# Patient Record
Sex: Female | Born: 1945 | Race: White | Hispanic: No | Marital: Married | Smoking: Never smoker
Health system: Southern US, Community
[De-identification: ages and names within clinical notes are randomized; demographics above are authoritative.]

## PROBLEM LIST (undated history)

## (undated) DIAGNOSIS — C801 Malignant (primary) neoplasm, unspecified: Secondary | ICD-10-CM

## (undated) DIAGNOSIS — T4145XA Adverse effect of unspecified anesthetic, initial encounter: Secondary | ICD-10-CM

## (undated) DIAGNOSIS — R51 Headache: Secondary | ICD-10-CM

## (undated) DIAGNOSIS — N950 Postmenopausal bleeding: Secondary | ICD-10-CM

## (undated) DIAGNOSIS — L299 Pruritus, unspecified: Secondary | ICD-10-CM

## (undated) DIAGNOSIS — R112 Nausea with vomiting, unspecified: Secondary | ICD-10-CM

## (undated) DIAGNOSIS — Z9889 Other specified postprocedural states: Secondary | ICD-10-CM

## (undated) DIAGNOSIS — R52 Pain, unspecified: Secondary | ICD-10-CM

## (undated) DIAGNOSIS — E039 Hypothyroidism, unspecified: Secondary | ICD-10-CM

## (undated) DIAGNOSIS — T8859XA Other complications of anesthesia, initial encounter: Secondary | ICD-10-CM

## (undated) HISTORY — PX: DILATION AND CURETTAGE OF UTERUS: SHX78

## (undated) HISTORY — PX: EXPLORATORY LAPAROTOMY: SUR591

## (undated) HISTORY — DX: Pruritus, unspecified: L29.9

## (undated) HISTORY — PX: BREAST SURGERY: SHX581

## (undated) HISTORY — DX: Pain, unspecified: R52

---

## 1998-04-08 ENCOUNTER — Other Ambulatory Visit: Admission: RE | Admit: 1998-04-08 | Discharge: 1998-04-08 | Payer: Self-pay | Admitting: Obstetrics & Gynecology

## 1999-04-10 ENCOUNTER — Other Ambulatory Visit: Admission: RE | Admit: 1999-04-10 | Discharge: 1999-04-10 | Payer: Self-pay | Admitting: Obstetrics & Gynecology

## 1999-05-07 ENCOUNTER — Other Ambulatory Visit: Admission: RE | Admit: 1999-05-07 | Discharge: 1999-05-07 | Payer: Self-pay | Admitting: Family Medicine

## 2000-05-17 ENCOUNTER — Other Ambulatory Visit: Admission: RE | Admit: 2000-05-17 | Discharge: 2000-05-17 | Payer: Self-pay | Admitting: Obstetrics & Gynecology

## 2001-06-13 ENCOUNTER — Other Ambulatory Visit: Admission: RE | Admit: 2001-06-13 | Discharge: 2001-06-13 | Payer: Self-pay | Admitting: Obstetrics & Gynecology

## 2002-07-31 ENCOUNTER — Other Ambulatory Visit: Admission: RE | Admit: 2002-07-31 | Discharge: 2002-07-31 | Payer: Self-pay | Admitting: Obstetrics & Gynecology

## 2003-08-07 ENCOUNTER — Other Ambulatory Visit: Admission: RE | Admit: 2003-08-07 | Discharge: 2003-08-07 | Payer: Self-pay | Admitting: Obstetrics & Gynecology

## 2004-09-14 ENCOUNTER — Other Ambulatory Visit: Admission: RE | Admit: 2004-09-14 | Discharge: 2004-09-14 | Payer: Self-pay | Admitting: Obstetrics & Gynecology

## 2006-10-04 HISTORY — PX: FINGER SURGERY: SHX640

## 2008-02-07 ENCOUNTER — Ambulatory Visit (HOSPITAL_BASED_OUTPATIENT_CLINIC_OR_DEPARTMENT_OTHER): Admission: RE | Admit: 2008-02-07 | Discharge: 2008-02-07 | Payer: Self-pay | Admitting: *Deleted

## 2008-02-07 ENCOUNTER — Encounter (INDEPENDENT_AMBULATORY_CARE_PROVIDER_SITE_OTHER): Payer: Self-pay | Admitting: *Deleted

## 2010-08-03 ENCOUNTER — Encounter: Admission: RE | Admit: 2010-08-03 | Discharge: 2010-08-03 | Payer: Self-pay | Admitting: Radiology

## 2010-08-05 ENCOUNTER — Ambulatory Visit: Payer: Self-pay | Admitting: Oncology

## 2010-08-14 LAB — CBC WITH DIFFERENTIAL/PLATELET
BASO%: 0.3 % (ref 0.0–2.0)
Basophils Absolute: 0 10*3/uL (ref 0.0–0.1)
EOS%: 0.7 % (ref 0.0–7.0)
Eosinophils Absolute: 0.1 10*3/uL (ref 0.0–0.5)
HCT: 40.8 % (ref 34.8–46.6)
HGB: 14 g/dL (ref 11.6–15.9)
LYMPH%: 22.7 % (ref 14.0–49.7)
MCH: 31 pg (ref 25.1–34.0)
MCHC: 34.4 g/dL (ref 31.5–36.0)
MCV: 90.1 fL (ref 79.5–101.0)
MONO#: 0.6 10*3/uL (ref 0.1–0.9)
MONO%: 6.8 % (ref 0.0–14.0)
NEUT#: 6.4 10*3/uL (ref 1.5–6.5)
NEUT%: 69.5 % (ref 38.4–76.8)
Platelets: 368 10*3/uL (ref 145–400)
RBC: 4.52 10*6/uL (ref 3.70–5.45)
RDW: 12.9 % (ref 11.2–14.5)
WBC: 9.2 10*3/uL (ref 3.9–10.3)
lymph#: 2.1 10*3/uL (ref 0.9–3.3)

## 2010-08-14 LAB — COMPREHENSIVE METABOLIC PANEL
ALT: 13 U/L (ref 0–35)
AST: 21 U/L (ref 0–37)
Albumin: 4.8 g/dL (ref 3.5–5.2)
Alkaline Phosphatase: 67 U/L (ref 39–117)
BUN: 15 mg/dL (ref 6–23)
CO2: 25 mEq/L (ref 19–32)
Calcium: 9.9 mg/dL (ref 8.4–10.5)
Chloride: 103 mEq/L (ref 96–112)
Creatinine, Ser: 0.75 mg/dL (ref 0.40–1.20)
Glucose, Bld: 120 mg/dL — ABNORMAL HIGH (ref 70–99)
Potassium: 3.9 mEq/L (ref 3.5–5.3)
Sodium: 142 mEq/L (ref 135–145)
Total Bilirubin: 0.5 mg/dL (ref 0.3–1.2)
Total Protein: 7.7 g/dL (ref 6.0–8.3)

## 2010-08-25 ENCOUNTER — Ambulatory Visit
Admission: RE | Admit: 2010-08-25 | Discharge: 2010-09-30 | Payer: Self-pay | Source: Home / Self Care | Attending: Radiation Oncology | Admitting: Radiation Oncology

## 2010-09-21 ENCOUNTER — Ambulatory Visit
Admission: RE | Admit: 2010-09-21 | Discharge: 2010-09-21 | Payer: Self-pay | Source: Home / Self Care | Attending: Surgery | Admitting: Surgery

## 2010-10-26 LAB — DIFFERENTIAL
Basophils Absolute: 0 10*3/uL (ref 0.0–0.1)
Basophils Relative: 0 % (ref 0–1)
Eosinophils Absolute: 0.3 10*3/uL (ref 0.0–0.7)
Eosinophils Relative: 3 % (ref 0–5)
Lymphocytes Relative: 17 % (ref 12–46)
Lymphs Abs: 1.9 10*3/uL (ref 0.7–4.0)
Monocytes Absolute: 0.9 10*3/uL (ref 0.1–1.0)
Monocytes Relative: 8 % (ref 3–12)
Neutro Abs: 7.8 10*3/uL — ABNORMAL HIGH (ref 1.7–7.7)
Neutrophils Relative %: 71 % (ref 43–77)

## 2010-10-26 LAB — CBC
HCT: 39.8 % (ref 36.0–46.0)
Hemoglobin: 13 g/dL (ref 12.0–15.0)
MCH: 30 pg (ref 26.0–34.0)
MCHC: 32.7 g/dL (ref 30.0–36.0)
RBC: 4.34 MIL/uL (ref 3.87–5.11)
RDW: 13.2 % (ref 11.5–15.5)
WBC: 11 10*3/uL — ABNORMAL HIGH (ref 4.0–10.5)

## 2010-10-28 ENCOUNTER — Ambulatory Visit (HOSPITAL_BASED_OUTPATIENT_CLINIC_OR_DEPARTMENT_OTHER)
Admission: RE | Admit: 2010-10-28 | Discharge: 2010-10-28 | Payer: Self-pay | Source: Home / Self Care | Attending: Surgery | Admitting: Surgery

## 2010-10-29 NOTE — Op Note (Addendum)
NAMESIARA, GORDER                ACCOUNT NO.:  [id number]  MEDICAL RECORD NO.:  [id number]          PATIENT TYPE:  AMB  LOCATION:  DSC                          FACILITY:  [facility]  PHYSICIAN:  Currie Paris, M.D.DATE OF BIRTH:  07/27/1946  DATE OF PROCEDURE:  10/28/2010 DATE OF DISCHARGE:                              OPERATIVE REPORT   PREOPERATIVE DIAGNOSIS:  Ductal carcinoma in situ, left breast, upper outer quadrant with positive margins, status post lumpectomy.  POSTOPERATIVE DIAGNOSIS:  Ductal carcinoma in situ, left breast, upper outer quadrant with positive margins, status post lumpectomy.  PROCEDURE:  Re-excision lumpectomy cavity (superior lateral margins).  SURGEON:  Currie Paris, MD  ANESTHESIA:  General.  CLINICAL HISTORY:  This is a 65 year old lady who was diagnosed with DCIS of the left breast, upper outer quadrant.  She underwent a needle- guided lumpectomy and at the time of surgery after the lumpectomy had been done with tissue around the guidewire, a small area of palpable abnormality was noted along the lateral superior margin and that was taken.  Unfortunately that was a separate area of DCIS and the margin on that was involved.  Subsequently, she underwent a mammogram showing no residual calcifications, the initial area had been removed.  She was then scheduled for re-excision.  She wished to continue to pursue breast conservation.  DESCRIPTION OF PROCEDURE:  I saw the patient in the holding area and she had no further questions.  We confirmed the procedure as noted above and I initialed the left breast.  We noted that DCIS is a microscopic disease and I will have really no visual clues as to how much tissue to take and planned to take about a centimeter thick area of tissue trying to get negative margins.  The patient was then taken to the operating room and after satisfactory general (LMA) anesthesia had been obtained, the left breast  was prepped and draped and the time-out was done.  I opened up almost all the prior excision, leaving perhaps 2 cm medially unopened.  I divided the subcutaneous tissue with cautery and entered all the lumpectomy cavity.  I then excised the lateral half of the superior margin as well as the lateral margin.  I took what appeared to be about a centimeter thick, although as it came out, it contracted somewhat.  I was almost towards the axilla and the margins left behind for all fatty tissue with no obvious breast tissue present.  I was down to the chest wall, and there was no fascia left.  I saw the lateral clip and that was came out as we were working, and I think the original superior clip came out as well, so I put two new clips in prior to closing.  Once I had this tissue out, I marked it for orientation and sent it to Pathology for permanent exam.  I put 20 mL of 0.25% plain Marcaine in to help with postop analgesia. We irrigated and made sure everything was dry, and then closed it with 3- 0 Vicryl, 4-0 Monocryl subcuticular and Dermabond. The patient tolerated the procedure well, and there  were no complications.  All counts were correct.     Currie Paris, M.D.     CJS/MEDQ  D:  10/28/2010  T:  10/28/2010  Job:  [id number]  cc:   Jeralyn Ruths, MD Ilda Mori, M.D. Maryln Gottron, M.D. Valentino Hue. Magrinat, M.D.  Electronically Signed by Cyndia Bent M.D. on 10/29/2010 07:31:44 AM

## 2010-10-30 ENCOUNTER — Ambulatory Visit: Payer: Self-pay | Admitting: Oncology

## 2010-11-02 ENCOUNTER — Ambulatory Visit
Admission: RE | Admit: 2010-11-02 | Discharge: 2010-11-03 | Payer: Self-pay | Source: Home / Self Care | Attending: Radiation Oncology | Admitting: Radiation Oncology

## 2010-12-14 LAB — BASIC METABOLIC PANEL
BUN: 12 mg/dL (ref 6–23)
CO2: 28 mEq/L (ref 19–32)
Calcium: 9.3 mg/dL (ref 8.4–10.5)
Chloride: 105 mEq/L (ref 96–112)
Creatinine, Ser: 0.64 mg/dL (ref 0.4–1.2)
GFR calc Af Amer: >60 mL/min (ref >60)
GFR calc non Af Amer: >60 mL/min (ref >60)
Glucose, Bld: 95 mg/dL (ref 70–99)
Potassium: 3.9 mEq/L (ref 3.5–5.1)
Sodium: 139 mEq/L (ref 135–145)

## 2010-12-14 LAB — POCT HEMOGLOBIN-HEMACUE: Hemoglobin: 14.2 g/dL (ref 12.0–15.0)

## 2011-02-16 NOTE — Op Note (Signed)
NAMEJAZIRA, [name]                ACCOUNT NO.:  [id number]   MEDICAL RECORD NO.:  [id number]          PATIENT TYPE:  AMB   LOCATION:  DSC                          FACILITY:  [facility]   PHYSICIAN:  Tennis Must Meyerdierks, M.D.DATE OF BIRTH:  [birth year]   DATE OF PROCEDURE:  02/07/2008  DATE OF DISCHARGE:                               OPERATIVE REPORT   PREOPERATIVE DIAGNOSIS:  Osteomyelitis distal phalanx left ring finger.   POSTOPERATIVE DIAGNOSIS:  Osteomyelitis distal phalanx left ring finger.   PROCEDURE:  Incision and drainage of distal phalanx left ring finger.   SURGEON:  Lowell Bouton, MD   ANESTHESIA:  General.   OPERATIVE FINDINGS:  The patient had a puncture wound over the volar  aspect of the DIP crease.  It was in the midline and there was some  scarring around the opening subcutaneously.  There was a defect  longitudinally in the profundus tendon attachment that went down to the  bone.  It was consistent with a cat bite going through the skin through  the tendon and into the bone.  There was purulent material in the bone  and in the subcutaneous tissues.   PROCEDURE:  Under general anesthesia with a tourniquet on the left arm,  the left hand was prepped and draped in the usual fashion and after  elevating the limb, the tourniquet was inflated to 250 mmHg.  A Brunner  approach was made to the DIP joint and blunt dissection was carried  through the subcutaneous tissues.  A flap was elevated ulnarly and the  flexor tendon was identified.  It was found to have a longitudinal split  with a hole that went down to the distal phalanx.  The distal phalanx  was drilled with a 45 K-wire and then curetted.  Cultures were obtained  in the subcutaneous tissues and also in the bone.  Some of the bone was  curetted and sent to pathology.  The bone was then irrigated with a 22-  gauge needle and a 10 mL syringe using saline.  The wound was packed  with iodoform gauze  into the bone.  The skin was partially closed with 4-  0 nylon sutures.  A 0.50% Marcaine digital block was inserted into the  finger for pain control.  Sterile dressings were applied.  The  tourniquet was released with good circulation of the hand.  The patient  went to the recovery room, awake, stable and in good condition.      Lowell Bouton, M.D.  Electronically Signed     EMM/MEDQ  D:  02/07/2008  T:  02/08/2008  Job:  [id number]   cc:   Harrel Lemon. Merla Riches, M.D.

## 2011-04-09 ENCOUNTER — Ambulatory Visit (HOSPITAL_COMMUNITY): Admission: RE | Admit: 2011-04-09 | Payer: Self-pay | Source: Ambulatory Visit | Admitting: Obstetrics & Gynecology

## 2011-04-13 ENCOUNTER — Encounter (HOSPITAL_COMMUNITY)
Admission: RE | Admit: 2011-04-13 | Discharge: 2011-04-13 | Disposition: A | Discharge status: Home / Self Care | Payer: Medicare Other | Source: Ambulatory Visit | Attending: Obstetrics & Gynecology | Admitting: Obstetrics & Gynecology

## 2011-04-13 ENCOUNTER — Other Ambulatory Visit: Payer: Self-pay

## 2011-04-13 ENCOUNTER — Encounter (HOSPITAL_COMMUNITY): Payer: Self-pay

## 2011-04-13 HISTORY — DX: Nausea with vomiting, unspecified: R11.2

## 2011-04-13 HISTORY — DX: Other specified postprocedural states: Z98.890

## 2011-04-13 HISTORY — DX: Hypothyroidism, unspecified: E03.9

## 2011-04-13 HISTORY — DX: Headache: R51

## 2011-04-13 HISTORY — DX: Other complications of anesthesia, initial encounter: T88.59XA

## 2011-04-13 HISTORY — DX: Adverse effect of unspecified anesthetic, initial encounter: T41.45XA

## 2011-04-13 HISTORY — DX: Malignant (primary) neoplasm, unspecified: C80.1

## 2011-04-13 LAB — CBC
Hemoglobin: 13.3 g/dL (ref 12.0–15.0)
MCH: 30.7 pg (ref 26.0–34.0)
MCHC: 32.8 g/dL (ref 30.0–36.0)
MCV: 93.5 fL (ref 78.0–100.0)
Platelets: 283 10*3/uL (ref 150–400)
RBC: 4.33 MIL/uL (ref 3.87–5.11)
RDW: 13.6 % (ref 11.5–15.5)
WBC: 11.1 10*3/uL — ABNORMAL HIGH (ref 4.0–10.5)

## 2011-04-13 NOTE — Anesthesia Preprocedure Evaluation (Addendum)
Anesthesia Evaluation  Name, MR# and DOB Patient awake  General Assessment Comment  Reviewed: Allergy & Precautions, H&P  and Patient's Chart, lab work & pertinent test results  Airway Mallampati: II TM Distance: >3 FB     Dental  (+) Teeth Intact   Pulmonaryneg pulmonary ROS        Cardiovascular regular Normal   Neuro/Psych  GI/Hepatic/Renal negative GI ROS, negative Liver ROS, and negative Renal ROS (+)       Endo/Other   Abdominal   Musculoskeletal negative musculoskeletal ROS (+)  Hematology negative hematology ROS (+)   Peds  Reproductive/Obstetrics negative OB ROS   Anesthesia Other Findings             Anesthesia Physical Anesthesia Plan  ASA: II  Anesthesia Plan: General   Post-op Pain Management:    Induction:   Airway Management Planned:   Additional Equipment:   Intra-op Plan:   Post-operative Plan:   Informed Consent: I have reviewed the patients History and Physical, chart, labs and discussed the procedure including the risks, benefits and alternatives for the proposed anesthesia with the patient or authorized representative who has indicated his/her understanding and acceptance.   Dental Advisory Given  Plan Discussed with:   Anesthesia Plan Comments:         Anesthesia Quick Evaluation

## 2011-04-13 NOTE — Patient Instructions (Addendum)
20 Janne Faulk  04/13/2011   Your procedure is scheduled on:  04/16/11  Report to [facility] at 0600 AM.  Call this number if you have problems the morning of surgery: [phone]   Remember:   Do not eat food:After Midnight.  Do not drink clear liquids: After Midnight.  Take these medicines the morning of surgery with A SIP OF WATER: none   Do not wear jewelry, make-up or nail polish.  Do not bring valuables to the hospital.  Contacts, dentures or bridgework may not be worn into surgery.  Leave suitcase in the car. After surgery it may be brought to your room.  For patients admitted to the hospital, checkout time is 11:00 AM the day of discharge.   Patients discharged the day of surgery will not be allowed to drive home.  Name and phone number of your driver: [phone]  Special Instructions: CHG Shower Shower 2 days before surgery and 1 day before surgery with Hibiclens.   Please read over the following fact sheets that you were given: Pain Booklet and Surgical Site Infection Prevention

## 2011-04-15 NOTE — H&P (Signed)
Kaitelyn Jamison is an 65 y.o. female. G 0  Pertinent Gynecological History: Menses: post-menopausal Bleeding: post menopausal bleeding Previous GYN Procedures: None  Last mammogram: Breast cancer currently on Tamoxifin Date: 2011 Last pap: normal Date: 2011     Past Medical History  Diagnosis Date  . Complication of anesthesia     throat felt tight upon waking up, later had fluid  retention of hands and feet  . PONV (postoperative nausea and vomiting)   . Cancer   . Hypothyroidism   . Headache     Past Surgical History  Procedure Date  . Breast surgery   . Finger surgery 2008  . Exploratory laparotomy     No family history on file.  Social History:  reports that she has never smoked. She does not have any smokeless tobacco history on file. She reports that she drinks about .6 ounces of alcohol per week. She reports that she does not use illicit drugs.  Allergies:  Allergies  Allergen Reactions  . Percocet (Oxycodone-Acetaminophen) Rash    No prescriptions prior to admission    ROS Non-contributory  Physical Exam Cardiovascular exam was normal.  Her pelvic exam showed no unusual masses or tenderness.  Her uterus was normal size and anterior.   Assessment/Plan: She is under treatment for early stage breast cancer with Tamoxifen.  She developed some bleeding and her ultrasound showed a thickened endometrium.  She requested hysteroscopy and D&C for diagnosis since it is more sensitive and less painful than an office [place].     Isai Gottlieb D 04/15/2011, 4:16 PM

## 2011-04-16 ENCOUNTER — Encounter (HOSPITAL_COMMUNITY): Payer: Self-pay | Admitting: Anesthesiology

## 2011-04-16 ENCOUNTER — Ambulatory Visit (HOSPITAL_COMMUNITY)
Admission: RE | Admit: 2011-04-16 | Discharge: 2011-04-16 | Disposition: A | Discharge status: Home / Self Care | Payer: Medicare Other | Source: Ambulatory Visit | Attending: Obstetrics & Gynecology | Admitting: Obstetrics & Gynecology

## 2011-04-16 ENCOUNTER — Other Ambulatory Visit: Payer: Self-pay | Admitting: Obstetrics & Gynecology

## 2011-04-16 ENCOUNTER — Encounter (HOSPITAL_COMMUNITY): Payer: Self-pay | Admitting: Obstetrics & Gynecology

## 2011-04-16 ENCOUNTER — Encounter (HOSPITAL_COMMUNITY)
Admission: RE | Disposition: A | Discharge status: Home / Self Care | Payer: Self-pay | Source: Ambulatory Visit | Attending: Obstetrics & Gynecology

## 2011-04-16 ENCOUNTER — Ambulatory Visit (HOSPITAL_COMMUNITY): Payer: Medicare Other | Admitting: Anesthesiology

## 2011-04-16 DIAGNOSIS — R9389 Abnormal findings on diagnostic imaging of other specified body structures: Secondary | ICD-10-CM | POA: Insufficient documentation

## 2011-04-16 DIAGNOSIS — N840 Polyp of corpus uteri: Secondary | ICD-10-CM | POA: Insufficient documentation

## 2011-04-16 DIAGNOSIS — N950 Postmenopausal bleeding: Secondary | ICD-10-CM | POA: Diagnosis present

## 2011-04-16 DIAGNOSIS — Z01812 Encounter for preprocedural laboratory examination: Secondary | ICD-10-CM | POA: Insufficient documentation

## 2011-04-16 DIAGNOSIS — Z01818 Encounter for other preprocedural examination: Secondary | ICD-10-CM | POA: Insufficient documentation

## 2011-04-16 HISTORY — DX: Postmenopausal bleeding: N95.0

## 2011-04-16 SURGERY — DILATATION & CURETTAGE/HYSTEROSCOPY WITH RESECTOCOPE
Anesthesia: Choice

## 2011-04-16 MED ORDER — MIDAZOLAM HCL 5 MG/5ML IJ SOLN
INTRAMUSCULAR | Status: DC | PRN
  Administered 2011-04-16: 2 mg via INTRAVENOUS

## 2011-04-16 MED ORDER — ONDANSETRON HCL 4 MG/2ML IJ SOLN: 4.0000 mg | Freq: Once | INTRAMUSCULAR | Status: DC | PRN

## 2011-04-16 MED ORDER — ACETAMINOPHEN 325 MG PO TABS: 325.0000 mg | ORAL_TABLET | ORAL | Status: DC | PRN

## 2011-04-16 MED ORDER — DEXAMETHASONE SODIUM PHOSPHATE 10 MG/ML IJ SOLN
INTRAMUSCULAR | Status: AC
  Filled 2011-04-16: qty 1

## 2011-04-16 MED ORDER — FENTANYL CITRATE 0.05 MG/ML IJ SOLN
25.0000 ug | INTRAMUSCULAR | Status: DC | PRN
  Administered 2011-04-16: 50 ug via INTRAVENOUS

## 2011-04-16 MED ORDER — GLYCINE 1.5 % IR SOLN
Status: DC | PRN
  Administered 2011-04-16: 3000 mL

## 2011-04-16 MED ORDER — PROPOFOL 10 MG/ML IV EMUL
INTRAVENOUS | Status: AC
  Filled 2011-04-16: qty 40

## 2011-04-16 MED ORDER — MEPERIDINE HCL 25 MG/ML IJ SOLN: 6.2500 mg | INTRAMUSCULAR | Status: DC | PRN

## 2011-04-16 MED ORDER — PROPOFOL 10 MG/ML IV EMUL
INTRAVENOUS | Status: AC
  Filled 2011-04-16: qty 20

## 2011-04-16 MED ORDER — LACTATED RINGERS IV SOLN
INTRAVENOUS | Status: DC
  Administered 2011-04-16 (×2): via INTRAVENOUS
  Filled 2011-04-16: qty 1000

## 2011-04-16 MED ORDER — LIDOCAINE HCL (CARDIAC) 20 MG/ML IV SOLN
INTRAVENOUS | Status: AC
  Filled 2011-04-16: qty 5

## 2011-04-16 MED ORDER — FENTANYL CITRATE 0.05 MG/ML IJ SOLN
INTRAMUSCULAR | Status: DC | PRN
  Administered 2011-04-16: 100 ug via INTRAVENOUS

## 2011-04-16 MED ORDER — MIDAZOLAM HCL 2 MG/2ML IJ SOLN
INTRAMUSCULAR | Status: AC
  Filled 2011-04-16: qty 2

## 2011-04-16 MED ORDER — ONDANSETRON HCL 4 MG/2ML IJ SOLN
INTRAMUSCULAR | Status: AC
  Filled 2011-04-16: qty 2

## 2011-04-16 MED ORDER — LIDOCAINE HCL 2 % IJ SOLN
INTRAMUSCULAR | Status: DC | PRN
  Administered 2011-04-16: 10 mL via INTRADERMAL

## 2011-04-16 MED ORDER — LIDOCAINE HCL 2 % EX GEL
CUTANEOUS | Status: DC | PRN
  Administered 2011-04-16: 80 via TOPICAL

## 2011-04-16 MED ORDER — ONDANSETRON HCL 4 MG/2ML IJ SOLN
INTRAMUSCULAR | Status: DC | PRN
  Administered 2011-04-16: 4 mg via INTRAVENOUS

## 2011-04-16 MED ORDER — PROPOFOL 10 MG/ML IV EMUL
INTRAVENOUS | Status: DC | PRN
  Administered 2011-04-16: 30 mg via INTRAVENOUS
  Administered 2011-04-16: 40 mg via INTRAVENOUS
  Administered 2011-04-16 (×2): 50 mg via INTRAVENOUS
  Administered 2011-04-16: 20 mg via INTRAVENOUS
  Administered 2011-04-16: 30 mg via INTRAVENOUS
  Administered 2011-04-16: 20 mg via INTRAVENOUS
  Administered 2011-04-16: 30 mg via INTRAVENOUS

## 2011-04-16 MED ORDER — FENTANYL CITRATE 0.05 MG/ML IJ SOLN
INTRAMUSCULAR | Status: AC
  Filled 2011-04-16: qty 2

## 2011-04-16 SURGICAL SUPPLY — 16 items
CANISTER SUCTION 2500CC (MISCELLANEOUS) ×2 IMPLANT
CATH ROBINSON RED A/P 16FR (CATHETERS) ×2 IMPLANT
CLOTH BEACON ORANGE TIMEOUT ST (SAFETY) ×2 IMPLANT
CONTAINER PREFILL 10% NBF 60ML (FORM) ×2 IMPLANT
DRAPE UTILITY XL STRL (DRAPES) ×4 IMPLANT
ELECT REM PT RETURN 9FT ADLT (ELECTROSURGICAL) ×2
ELECTRODE REM PT RTRN 9FT ADLT (ELECTROSURGICAL) ×1 IMPLANT
ELECTRODE ROLLER BARREL 22FR (ELECTROSURGICAL) IMPLANT
ELECTRODE VAPORCUT 22FR (ELECTROSURGICAL) IMPLANT
GLOVE BIO SURGEON STRL SZ 6.5 (GLOVE) ×4 IMPLANT
GOWN BRE IMP SLV AUR LG STRL (GOWN DISPOSABLE) ×4 IMPLANT
LOOP ANGLED CUTTING 22FR (CUTTING LOOP) ×2 IMPLANT
NEEDLE SPNL 20GX3.5 QUINCKE YW (NEEDLE) IMPLANT
PACK HYSTEROSCOPY LF (CUSTOM PROCEDURE TRAY) ×2 IMPLANT
TOWEL OR 17X24 6PK STRL BLUE (TOWEL DISPOSABLE) ×4 IMPLANT
WATER STERILE IRR 1000ML POUR (IV SOLUTION) ×2 IMPLANT

## 2011-04-16 NOTE — Anesthesia Postprocedure Evaluation (Signed)
  Anesthesia Post-op Note  Patient: Taylor [name]  Procedure(s) Performed:  DILATATION & CURRETTAGE/HYSTEROSCOPY WITH RESECTOCOPE  Patient is awake and responsive. Pain and nausea are reasonably well controlled. Vital signs are stable and clinically acceptable. Oxygen saturation is clinically acceptable. There are no apparent anesthetic complications at this time. Patient is ready for discharge.

## 2011-04-16 NOTE — Transfer of Care (Signed)
Immediate Anesthesia Transfer of Care Note  Patient: Taylor [name]  Procedure(s) Performed:  DILATATION & CURRETTAGE/HYSTEROSCOPY WITH RESECTOCOPE  Patient Location: PACU  Anesthesia Type: MAC  Level of Consciousness: awake, alert , oriented and patient cooperative  Airway & Oxygen Therapy: Patient Spontanous Breathing  Post-op Assessment: Report given to PACU RN, Post -op Vital signs reviewed and stable and Patient moving all extremities  Post vital signs: Reviewed and stable  Complications: No apparent anesthesia complications

## 2011-04-16 NOTE — Brief Op Note (Signed)
04/16/2011  8:52 AM  PATIENT:  Taylor [name]  65 y.o. female  PRE-OPERATIVE DIAGNOSIS:  post menopausal bleeding  POST-OPERATIVE DIAGNOSIS:  same  PROCEDURE:  Procedure(s): DILATATION & CURRETTAGE/HYSTEROSCOPY WITH RESECTOSCOPE/POLYPECTOMY  SURGEON:  Ilda Mori  ANESTHESIA:   local and IV sedation  ESTIMATED BLOOD LOSS: minimal   BLOOD ADMINISTERED:none  LOCAL MEDICATIONS USED: 2% LIDOCAINE 10 CC  SPECIMEN:  Source of Specimen:  endometrial polyps  DISPOSITION OF SPECIMEN:  PATHOLOGY  DICTATION #: [id number]  PATIENT DISPOSITION:  Home

## 2011-04-16 NOTE — Op Note (Addendum)
Preoperative diagnosis: Postmenopausal bleeding Postoperative diagnosis: Endometrial polyps Procedure: Hysteroscopy, DNC, polypectomy Surgeon: Ilda Mori Anesthesia: Paracervical block, IV sedation Estimated blood loss: Minimal Findings: Multiple 1-3 cm endometrial polyps  Indications: This is a 65 year old nulligravida female on tamoxifen who was found to have a thickened endometrium on office ultrasound. She has also experienced postmenopausal bleeding.  Procedure: The patient was placed in the dorsolithotomy position and the vagina and vulva were prepped and draped in sterile fashion. IV sedation was administered and a paracervical block with 10 mL of 2% lidocaine was placed. The internal os was dilated with Shawnie Pons dilators to a 21 [place]. The diagnostic hysteroscope was introduced and multiple polyps were found. The hysteroscope was removed and polyp forceps were used to remove the polyps. The hysteroscope was reinserted and several areas of incomplete movable were noted. The internal os was dilated to 5 [place] and the resectoscope was introduced. Using the small loop the remainder of the polyps were removed with cutting current. The uterine cavity was reinspected and felt to be free of polyps. The procedure was then terminated and the patient left the operative room in good condition.  This is Dr. Ilda Mori completing the dictation on Taylor [name].

## 2011-04-16 NOTE — Op Note (Signed)
Taylor [name], Taylor [name]                ACCOUNT NO.:  [id number]  MEDICAL RECORD NO.:  [id number]  LOCATION:  [facility]                          FACILITY:  [facility]  PHYSICIAN:  Ilda Mori, M.D.   DATE OF BIRTH:  1945/10/13  DATE OF PROCEDURE:  04/16/2011 DATE OF DISCHARGE:                              OPERATIVE REPORT   PREOPERATIVE DIAGNOSIS:  Postmenopausal bleeding.  POSTOPERATIVE DIAGNOSIS:  Endometrial polyps.  PROCEDURES:  Hysteroscopy, dilation and curettage and polypectomy with the aid of resectoscope.  SURGEON:  Ilda Mori, MD  ANESTHESIA:  IV sedation with paracervical block.  ESTIMATED BLOOD LOSS:  Minimal.  FINDINGS:  Multiple large endometrial polyps.  No suspicious findings for malignancy.  The endometrium was atrophic.  INDICATIONS:  This is a 65 year old female with breast cancers being treated with tamoxifen.  She noted some postmenopausal bleeding and an office ultrasound showed a thickened endometrial cavity.  She was brought to the operating room for hysteroscopy and D and C.  PROCEDURE:  The patient was brought to the operating room and placed in dorsal lithotomy position.  IV sedation was administered.  The perineum and vagina were prepped and draped in the sterile fashion.  A speculum was placed and the cervix was visualized, 10 mL of 2% lidocaine was placed in the paracervical tissues.  The uterus sounded to 8-cm.  The internal os was dilated with Shawnie Pons dilators to a 23-French.  A diagnostic hysteroscope was introduced and multiple polyps were seen. The hysteroscope was removed and a polyp forceps was used to remove the polyps.  Hysteroscope was reintroduced and some remnants of polyps were seen to be remaining and after multiple attempts, the decision was made to proceed with resectoscope.  The internal os was dilated to 27-French. The resectoscope was placed and using the small loop, the base of the polyps was removed so at the end of the procedure, all  the polyps appeared to be satisfactory, removed from the endometrial cavity.  The procedure was then terminated and the patient left the operating room in good condition.    Ilda Mori, M.D.    RK/MEDQ  D:  04/16/2011  T:  04/16/2011  Job:  [id number]

## 2011-05-04 ENCOUNTER — Ambulatory Visit (INDEPENDENT_AMBULATORY_CARE_PROVIDER_SITE_OTHER): Payer: Medicare Other | Admitting: Surgery

## 2011-05-04 ENCOUNTER — Encounter (INDEPENDENT_AMBULATORY_CARE_PROVIDER_SITE_OTHER): Payer: Self-pay | Admitting: Surgery

## 2011-05-04 VITALS — BP 134/82 | HR 68 | Temp 97.0°F

## 2011-05-04 DIAGNOSIS — Z853 Personal history of malignant neoplasm of breast: Secondary | ICD-10-CM

## 2011-05-04 NOTE — Progress Notes (Signed)
05/04/2011  Taylor [name] is a 65 y.o.female who presents for routine followup of her LUOQ DCISdiagnosed in Oct 2011 and treated with Lumpectomy and anti-estorgen, no radiation. She has no problems or concerns on either side.She notes a lack of firmness above the incison  PFSH SheThe only thing new here is that she developed uterine polyps from tamoxifen. She's had a D&C.  ROS There have been no significant changes since the last visit here  General: The patient is alert, oriented, generally healty appearing, NAD. Mood and affect are normal.  Breasts : Basically symmetric. The right breast is normal. There is some loss of breast tissue to palpation above the lumpectomy incision. There are no new masses and no suggestion of a recurrence Lymphatics: She has no axillary or supraclavicular adenopathy on either side.  Extremities: Full ROM of the surgical side with no lymphedema noted.  Data Reviewed: No new data - she did bring a review of her current plans  Impression: S/P lumpectomy for DCIS, left, no concerns identified   Plan: I told her we could see her here in six months but if she is doing close follow-up at [facility], then she could omit visits with me

## 2011-08-05 ENCOUNTER — Other Ambulatory Visit: Payer: Self-pay | Admitting: Radiology

## 2011-08-05 DIAGNOSIS — Z9889 Other specified postprocedural states: Secondary | ICD-10-CM

## 2011-08-05 DIAGNOSIS — Z853 Personal history of malignant neoplasm of breast: Secondary | ICD-10-CM

## 2011-09-21 ENCOUNTER — Ambulatory Visit
Admission: RE | Admit: 2011-09-21 | Discharge: 2011-09-21 | Disposition: A | Discharge status: Home / Self Care | Payer: Medicare Other | Source: Ambulatory Visit | Attending: Radiology | Admitting: Radiology

## 2011-09-21 DIAGNOSIS — Z853 Personal history of malignant neoplasm of breast: Secondary | ICD-10-CM

## 2011-09-21 DIAGNOSIS — Z9889 Other specified postprocedural states: Secondary | ICD-10-CM

## 2011-09-21 MED ORDER — GADOBENATE DIMEGLUMINE 529 MG/ML IV SOLN
11.0000 mL | Freq: Once | INTRAVENOUS | Status: AC | PRN
  Administered 2011-09-21: 11 mL via INTRAVENOUS

## 2012-04-03 ENCOUNTER — Other Ambulatory Visit: Payer: Self-pay

## 2012-05-20 ENCOUNTER — Ambulatory Visit: Payer: Medicare Other

## 2012-05-20 ENCOUNTER — Ambulatory Visit (INDEPENDENT_AMBULATORY_CARE_PROVIDER_SITE_OTHER): Payer: Medicare Other | Admitting: Internal Medicine

## 2012-05-20 VITALS — BP 102/70 | HR 62 | Temp 98.2°F | Resp 12 | Ht 62.5 in | Wt 130.0 lb

## 2012-05-20 DIAGNOSIS — S0990XA Unspecified injury of head, initial encounter: Secondary | ICD-10-CM
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
## 2012-08-04 ENCOUNTER — Other Ambulatory Visit: Payer: Self-pay | Admitting: Radiology

## 2012-08-04 DIAGNOSIS — Z9889 Other specified postprocedural states: Secondary | ICD-10-CM

## 2012-08-04 DIAGNOSIS — Z853 Personal history of malignant neoplasm of breast: Secondary | ICD-10-CM

## 2012-09-13 ENCOUNTER — Ambulatory Visit
Admission: RE | Admit: 2012-09-13 | Discharge: 2012-09-13 | Disposition: A | Discharge status: Home / Self Care | Payer: Medicare Other | Source: Ambulatory Visit | Attending: Radiology | Admitting: Radiology

## 2012-09-13 DIAGNOSIS — Z853 Personal history of malignant neoplasm of breast: Secondary | ICD-10-CM

## 2012-09-13 DIAGNOSIS — Z9889 Other specified postprocedural states: Secondary | ICD-10-CM

## 2012-09-13 MED ORDER — GADOBENATE DIMEGLUMINE 529 MG/ML IV SOLN
12.0000 mL | Freq: Once | INTRAVENOUS | Status: AC | PRN
  Administered 2012-09-13: 12 mL via INTRAVENOUS

## 2013-08-01 ENCOUNTER — Other Ambulatory Visit: Payer: Self-pay | Admitting: Internal Medicine

## 2013-08-01 DIAGNOSIS — D0592 Unspecified type of carcinoma in situ of left breast: Secondary | ICD-10-CM

## 2013-09-05 ENCOUNTER — Ambulatory Visit
Admission: RE | Admit: 2013-09-05 | Discharge: 2013-09-05 | Disposition: A | Discharge status: Home / Self Care | Payer: Medicare Other | Source: Ambulatory Visit | Attending: Internal Medicine | Admitting: Internal Medicine

## 2013-09-05 DIAGNOSIS — D0592 Unspecified type of carcinoma in situ of left breast: Secondary | ICD-10-CM

## 2013-09-05 MED ORDER — GADOBENATE DIMEGLUMINE 529 MG/ML IV SOLN
12.0000 mL | Freq: Once | INTRAVENOUS | Status: AC | PRN
  Administered 2013-09-05: 12 mL via INTRAVENOUS

## 2014-08-13 ENCOUNTER — Other Ambulatory Visit: Payer: Self-pay | Admitting: Cardiovascular Disease

## 2014-08-13 DIAGNOSIS — Z803 Family history of malignant neoplasm of breast: Secondary | ICD-10-CM

## 2014-08-13 DIAGNOSIS — Z853 Personal history of malignant neoplasm of breast: Secondary | ICD-10-CM

## 2014-08-27 ENCOUNTER — Ambulatory Visit
Admission: RE | Admit: 2014-08-27 | Discharge: 2014-08-27 | Disposition: A | Discharge status: Home / Self Care | Payer: Medicare Other | Source: Ambulatory Visit | Attending: Cardiovascular Disease | Admitting: Cardiovascular Disease

## 2014-08-27 DIAGNOSIS — Z803 Family history of malignant neoplasm of breast: Secondary | ICD-10-CM

## 2014-08-27 DIAGNOSIS — Z853 Personal history of malignant neoplasm of breast: Secondary | ICD-10-CM

## 2014-08-27 MED ORDER — GADOBENATE DIMEGLUMINE 529 MG/ML IV SOLN
12.0000 mL | Freq: Once | INTRAVENOUS | Status: AC | PRN
  Administered 2014-08-27: 12 mL via INTRAVENOUS

## 2015-05-05 ENCOUNTER — Ambulatory Visit (INDEPENDENT_AMBULATORY_CARE_PROVIDER_SITE_OTHER): Payer: Medicare Other | Admitting: Urgent Care

## 2015-05-05 VITALS — BP 128/82 | HR 128 | Temp 98.0°F | Resp 17 | Ht 63.0 in | Wt 133.0 lb

## 2015-05-05 DIAGNOSIS — S61219A Laceration without foreign body of unspecified finger without damage to nail, initial encounter: Secondary | ICD-10-CM | POA: Diagnosis not present

## 2015-05-05 DIAGNOSIS — S61218A Laceration without foreign body of other finger without damage to nail, initial encounter: Secondary | ICD-10-CM | POA: Diagnosis not present
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
## 2015-05-05 NOTE — Patient Instructions (Signed)

## 2015-05-09 NOTE — Progress Notes (Signed)
  Medical screening examination/treatment/procedure(s) were performed by non-physician practitioner and as supervising physician I was immediately available for consultation/collaboration.     

## 2015-05-15 ENCOUNTER — Ambulatory Visit (INDEPENDENT_AMBULATORY_CARE_PROVIDER_SITE_OTHER): Payer: Medicare Other | Admitting: Physician Assistant

## 2015-05-15 VITALS — BP 110/64 | HR 90 | Temp 99.2°F | Resp 14 | Ht 63.0 in | Wt 133.0 lb

## 2015-05-15 DIAGNOSIS — Z4802 Encounter for removal of sutures: Secondary | ICD-10-CM | POA: Diagnosis not present

## 2015-05-15 DIAGNOSIS — S61218A Laceration without foreign body of other finger without damage to nail, initial encounter: Secondary | ICD-10-CM | POA: Diagnosis not present

## 2015-05-15 DIAGNOSIS — S61219A Laceration without foreign body of unspecified finger without damage to nail, initial encounter: Secondary | ICD-10-CM
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
## 2015-05-19 NOTE — Progress Notes (Signed)
  Medical screening examination/treatment/procedure(s) were performed by non-physician practitioner and as supervising physician I was immediately available for consultation/collaboration.     

## 2015-06-25 ENCOUNTER — Encounter: Payer: Self-pay | Admitting: Family Medicine

## 2015-06-25 ENCOUNTER — Telehealth: Payer: Self-pay | Admitting: *Deleted

## 2015-06-25 ENCOUNTER — Ambulatory Visit (INDEPENDENT_AMBULATORY_CARE_PROVIDER_SITE_OTHER): Payer: Medicare Other | Admitting: Family Medicine

## 2015-06-25 ENCOUNTER — Ambulatory Visit (INDEPENDENT_AMBULATORY_CARE_PROVIDER_SITE_OTHER): Payer: Medicare Other

## 2015-06-25 VITALS — BP 108/72 | HR 69 | Temp 98.4°F | Resp 16 | Ht 62.5 in | Wt 130.4 lb

## 2015-06-25 DIAGNOSIS — S61221S Laceration with foreign body of left index finger without damage to nail, sequela: Secondary | ICD-10-CM

## 2015-06-25 DIAGNOSIS — M7989 Other specified soft tissue disorders: Secondary | ICD-10-CM

## 2015-06-25 DIAGNOSIS — M79645 Pain in left finger(s): Secondary | ICD-10-CM

## 2015-06-25 NOTE — Patient Instructions (Signed)
We will call you with an appointment to the [facility] In the meantime, it is ok to wrap for comfort- remove the bandage at least 4x day and do gentle range of motion exercises. Can apply moist heat to see if that helps prior to range of motion exercises.

## 2015-06-25 NOTE — Telephone Encounter (Signed)
Called patient left message in voice mail at home number to call back concerning referral to hand specialist.

## 2015-06-25 NOTE — Progress Notes (Signed)
   Subjective:    Patient ID: Taylor [name], female    DOB: [birth year], 69 y.o.   MRN: [id number]  HPI Patient presents today for follow up of left index finger laceration. She had 5 sutures placed in her left index finger following a laceration with door glass. She had 3 sutures placed in her left middle finger. She continues to have pain and feels like she is having decreased ROM. She has never had a decrease in the pain since the initial injury. Her index finger is worse than her middle finger and she has generalized as well as localized swelling  Tylenol does not help and she gets some relief with wrapping.  Swelling is down when she awakes, but increases as day goes on. She has been performing frequent massage on areas of scar tissue build up.   She had a previous injury to her left 4th finger and saw Dr. Koleen Distance.  Review of Systems No fever or chills, no drainage    Objective:   Physical Exam  Constitutional: She is oriented to person, place, and time. She appears well-developed and well-nourished. No distress.  HENT:  Head: Normocephalic and atraumatic.  Eyes: Conjunctivae are normal.  Neck: Normal range of motion. Neck supple.  Cardiovascular: Normal rate.   Pulmonary/Chest: Effort normal.  Musculoskeletal:  Dorsum of left index finger with approximately 1.5 cm raised area between PIP and DIP, firm, no erythema. Finger with generalized swelling, decreased ROM. Brisk cap refill.  Dorsum of third finger with smaller area of elevation and less swelling.  Neither with temperature change or drainage.   Neurological: She is alert and oriented to person, place, and time.  Skin: Skin is warm and dry. She is not diaphoretic.  Psychiatric: She has a normal mood and affect. Her behavior is normal. Judgment and thought content normal.  Vitals reviewed.  BP 108/72 mmHg  Pulse 69  Temp(Src) 98.4 F (36.9 C) (Oral)  Resp 16  Ht 5' 2.5" (1.588 m)  Wt 130 lb 6.4 oz (59.149 kg)  BMI 23.46  kg/m2 [facility] reading (PRIMARY) by  Dr. Lorelei Pont: soft tissue swelling. Possible foreign body but probably artifact on 3rd finger.     Assessment & Plan:  1. Laceration of left index finger with foreign body without damage to nail, sequela - DG Finger Index Left; Future - Ambulatory referral to Hand Surgery  2. Swelling of finger - Ambulatory referral to Hand Surgery  3. Pain of finger of left hand - Ambulatory referral to Hand Surgery  - suggested she can wrap during the day for comfort and protection and remove bandage at least 4x/day for ROM  Clarene Reamer, FNP-BC  [facility]  06/25/2015 5:45 PM

## 2015-06-30 ENCOUNTER — Telehealth: Payer: Self-pay

## 2015-06-30 NOTE — Telephone Encounter (Signed)
Patient called to ask for a cd of her x-ray on 06/25/15.  Please call once it is ready for pick-up.  CB#: [phone]

## 2015-07-01 ENCOUNTER — Telehealth: Payer: Self-pay

## 2015-07-01 NOTE — Telephone Encounter (Signed)
Patient requesting to pick up a copy of her xrays of her left hand today at 2 pm. She has an appointment tomorrow with a hand specialist and they will not see her without. Patient call back number is [phone], patient stated she will be here today 2 pm to pick it up.

## 2015-07-01 NOTE — Telephone Encounter (Signed)
Copy of message sent to xray. 

## 2015-07-01 NOTE — Telephone Encounter (Signed)
Sent copy of message to xray.

## 2015-08-18 ENCOUNTER — Other Ambulatory Visit: Payer: Self-pay | Admitting: Internal Medicine

## 2015-08-19 ENCOUNTER — Other Ambulatory Visit: Payer: Self-pay | Admitting: Internal Medicine

## 2015-08-19 DIAGNOSIS — C50919 Malignant neoplasm of unspecified site of unspecified female breast: Secondary | ICD-10-CM

## 2015-09-09 ENCOUNTER — Ambulatory Visit
Admission: RE | Admit: 2015-09-09 | Discharge: 2015-09-09 | Disposition: A | Discharge status: Home / Self Care | Payer: Medicare Other | Source: Ambulatory Visit | Attending: Internal Medicine | Admitting: Internal Medicine

## 2015-09-09 DIAGNOSIS — C50919 Malignant neoplasm of unspecified site of unspecified female breast: Secondary | ICD-10-CM

## 2015-09-09 MED ORDER — GADOBENATE DIMEGLUMINE 529 MG/ML IV SOLN
11.0000 mL | Freq: Once | INTRAVENOUS | Status: AC | PRN
  Administered 2015-09-09: 11 mL via INTRAVENOUS

## 2016-11-24 ENCOUNTER — Other Ambulatory Visit: Payer: Self-pay | Admitting: Obstetrics & Gynecology

## 2016-11-25 LAB — CYTOLOGY - PAP

## 2017-09-23 IMAGING — MR MR BREAST BILAT WO/W CM
9 of 13 series · 32 of 48 positions shown · IV contrast (multihance)
Comparison: MRI of the breast 08/27/2014, and 09/21/2011.

CLINICAL DATA: History of treated left breast cancer, status post
lumpectomy in 9544, and reexcision in October 2010 with a positive
margin. Family history of breast cancer in mother diagnosed at age
of 84. Patient is currently asymptomatic.

LABS:  Creatinine 0.6, BUN 16, GFR 92.
EXAM:
BILATERAL BREAST MRI WITH AND WITHOUT CONTRAST
TECHNIQUE: Multiplanar, multisequence MR images of both breasts were obtained
prior to and following the intravenous administration of 11 ml of
MultiHance.

[Series 2: T2 · axial · 3.0mm · 0.94mm/px · z∈[-101,+61]mm · 3 of 55 slices shown]
[im 1/55]
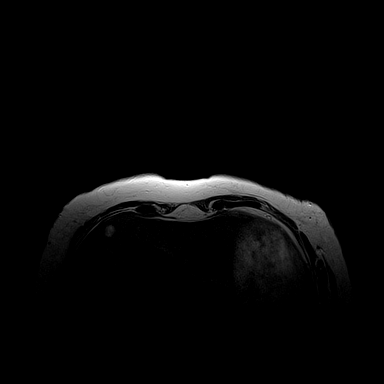
[im 28/55]
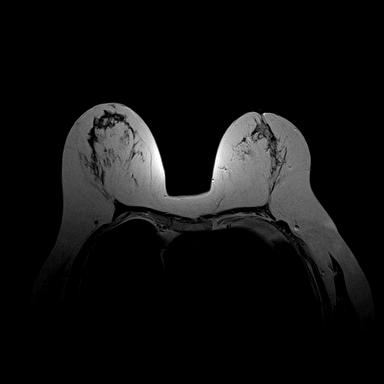
[im 55/55]
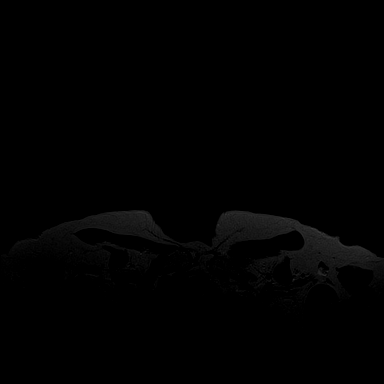

[Series 3: t2_tirm_tra ipat (a-p) · axial · 3.0mm · 0.70mm/px · z∈[-101,+61]mm · 2 of 55 slices shown]
[im 1/55]
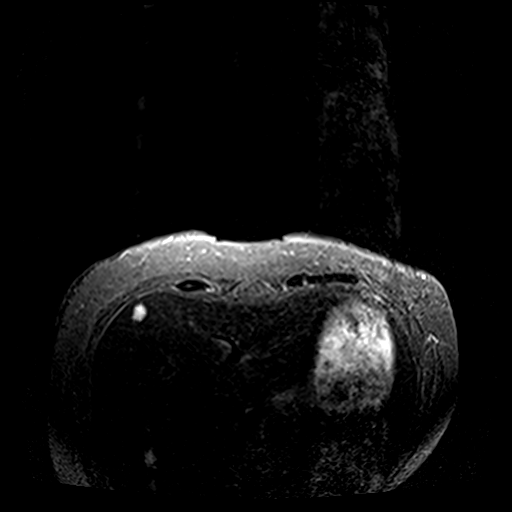
[im 55/55]
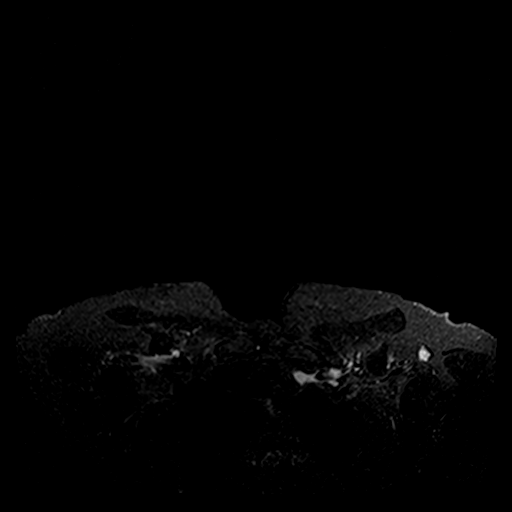

[Series 4: fl3d pre-cm no · axial · non-contrast · 1.2mm · 0.94mm/px · z∈[-106,+66]mm · 5 of 144 slices shown]
[im 1/144]
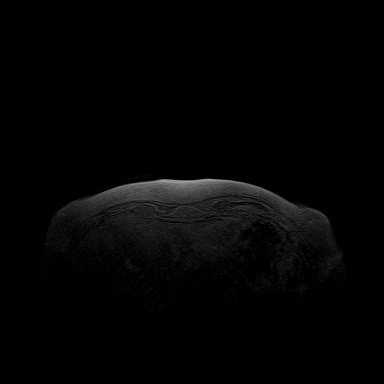
[im 36/144]
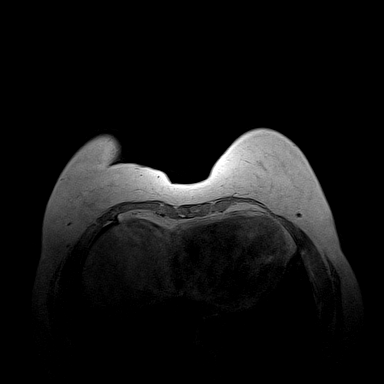
[im 72/144]
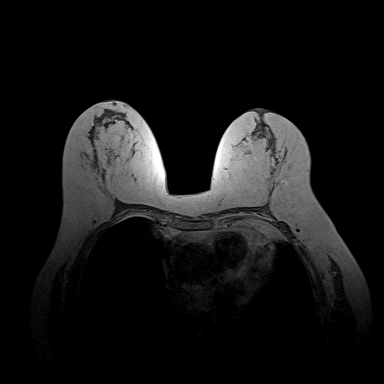
[im 108/144]
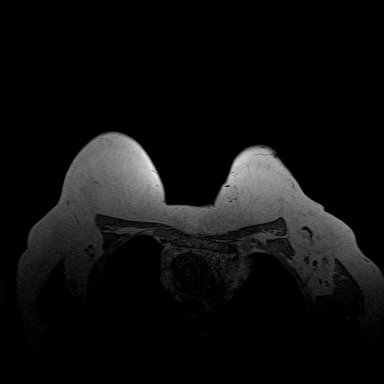
[im 144/144]
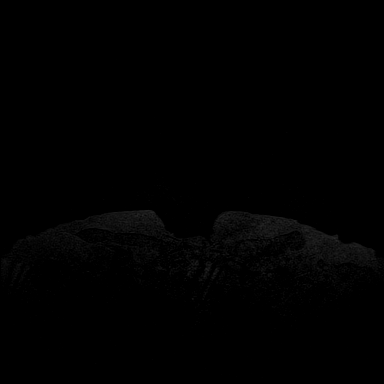

[Series 5: fl3d pre-cm · axial · non-contrast · 1.2mm · 0.94mm/px · z∈[-106,+66]mm · 5 of 144 slices shown]
[im 1/144]
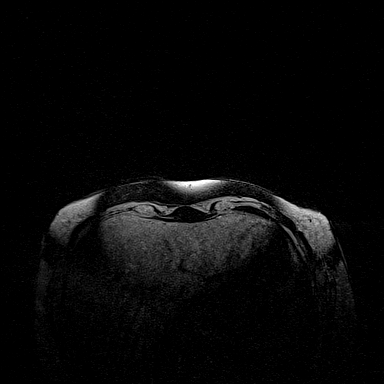
[im 36/144]
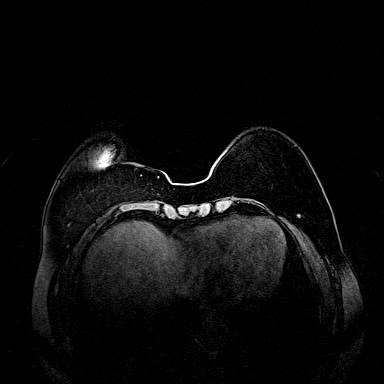
[im 72/144]
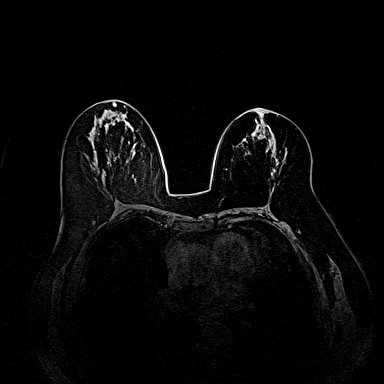
[im 108/144]
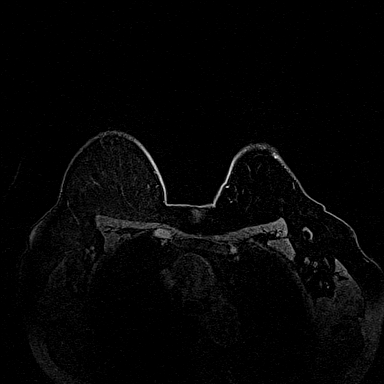
[im 144/144]
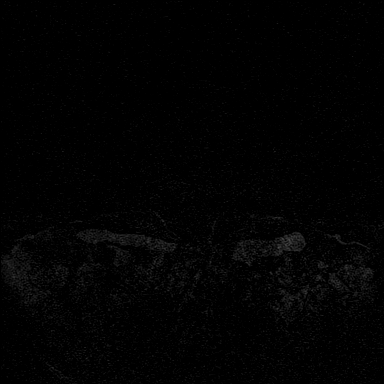

[Series 6: fl3d post-cm 20 · axial · 1.2mm · 0.94mm/px · z∈[-106,+66]mm · 5 of 144 slices shown (1 of 3)]
[im 1/144]
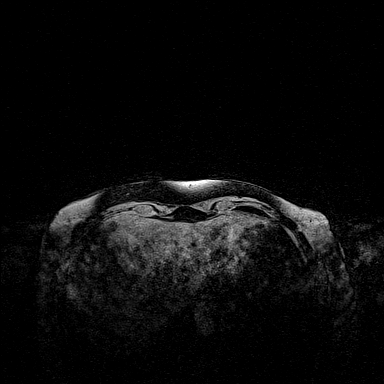
[im 36/144]
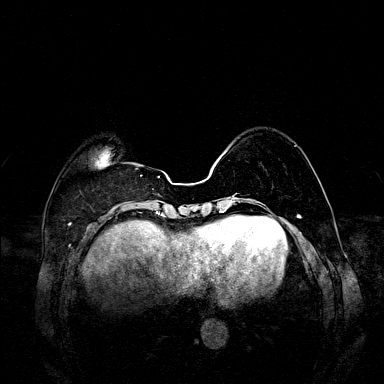
[im 72/144]
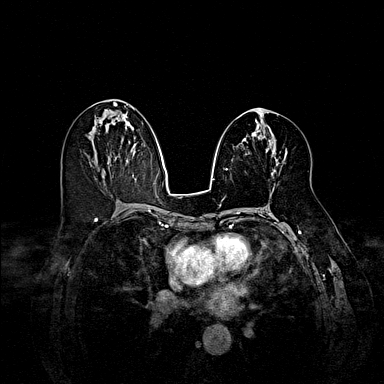
[im 108/144]
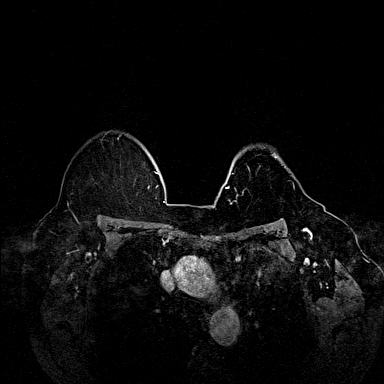
[im 144/144]
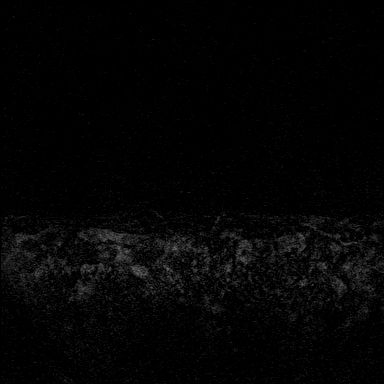

[Series 7: fl3d post-cm 20 · axial · 1.2mm · 0.94mm/px · z∈[-106,+66]mm · 5 of 144 slices shown (2 of 3)]
[im 1/144]
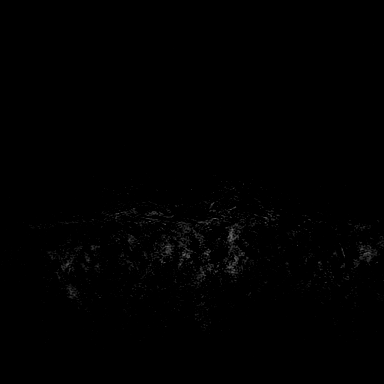
[im 36/144]
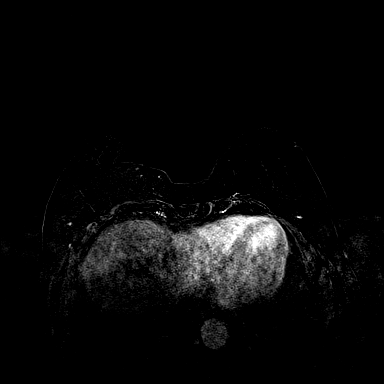
[im 72/144]
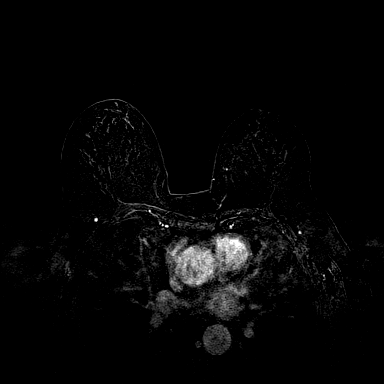
[im 108/144]
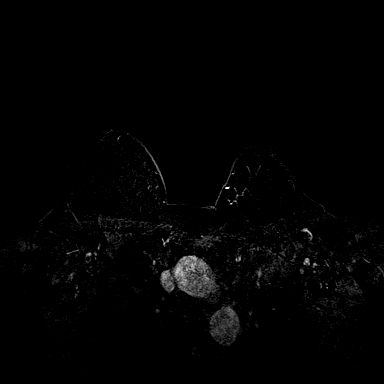
[im 144/144]
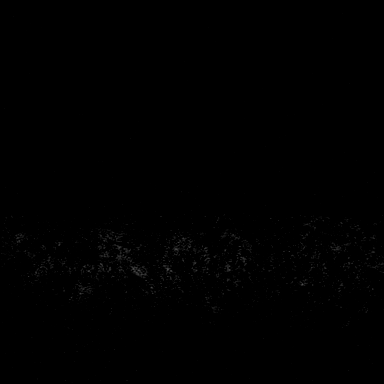

[Series 8: fl3d post-cm 20 · axial · 172.8mm · 0.94mm/px · 1 of 1 slices shown (3 of 3)]
[im 1/1]
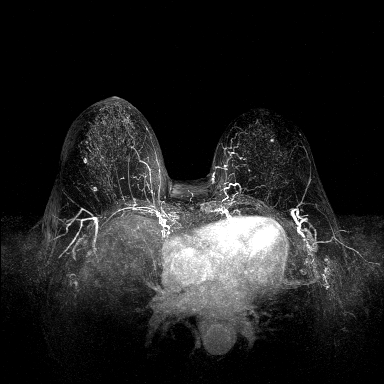

[Series 9: fl3d post-cm 3min · axial · 1.2mm · 0.94mm/px · z∈[-106,+66]mm · 5 of 144 slices shown]
[im 1/144]
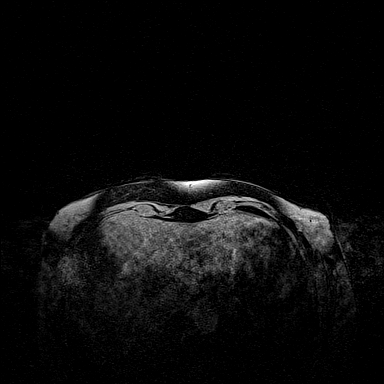
[im 36/144]
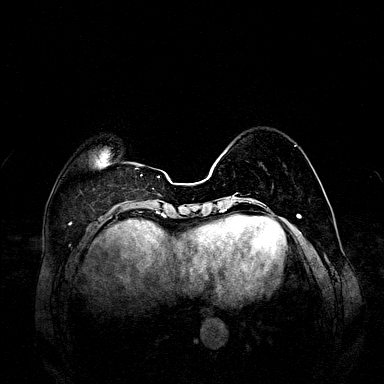
[im 72/144]
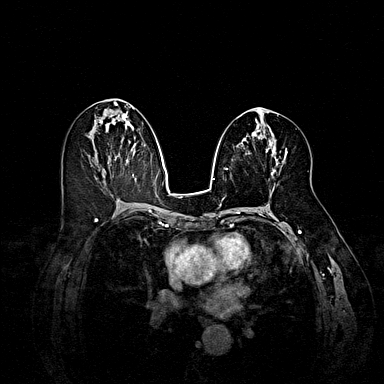
[im 108/144]
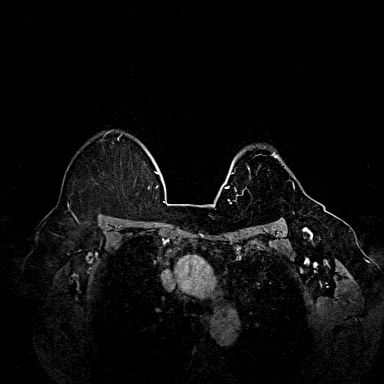
[im 144/144]
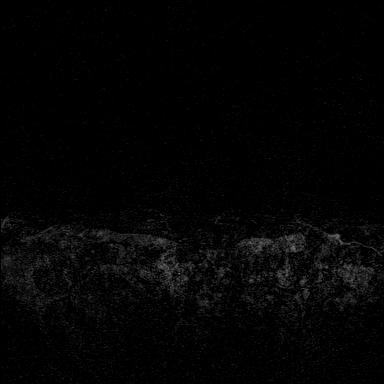

[Series 10: fl3d post-cm 3min_sub · axial · 1.2mm · 0.94mm/px · 1 of 144 slices shown]
[im 1/144]
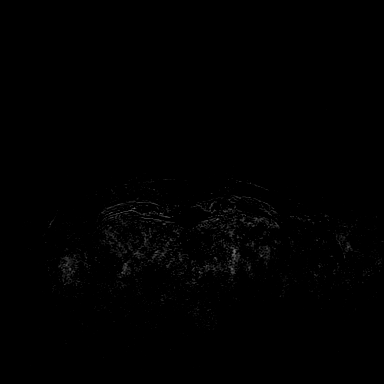

[32 of 48 positions shown; findings below may reference images not displayed]

THREE-DIMENSIONAL MR IMAGE RENDERING ON INDEPENDENT WORKSTATION:

Three-dimensional MR images were rendered by post-processing of the
original MR data on an independent workstation. The
three-dimensional MR images were interpreted, and findings are
reported in the following complete MRI report for this study. Three
dimensional images were evaluated at the independent [employer]
workstation
FINDINGS: Breast composition: c. Heterogeneous fibroglandular tissue.

Background parenchymal enhancement: Mild

Right breast: No mass or abnormal enhancement.

Left breast: No mass or abnormal enhancement. There is a stable
architectural distortion from prior lumpectomy.

Lymph nodes: No abnormal appearing lymph nodes.

Ancillary findings: Previously-seen 1.3 cm T2 bright nonenhancing
circumscribed septated liver mass is stable and consistent with a
benign finding.
IMPRESSION: No MRI evidence of malignancy in either breast, status post left
lumpectomy.

RECOMMENDATION:
Bilateral diagnostic mammography in 1 year.

BI-RADS CATEGORY  2: Benign.
